# Patient Record
Sex: Male | Born: 1990 | Race: White | Hispanic: No | Marital: Single | Smoking: Current every day smoker
Health system: Southern US, Community
[De-identification: ages and names within clinical notes are randomized; demographics above are authoritative.]

## PROBLEM LIST (undated history)

## (undated) DIAGNOSIS — J45909 Unspecified asthma, uncomplicated: Secondary | ICD-10-CM

## (undated) HISTORY — DX: Unspecified asthma, uncomplicated: J45.909

---
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
## 2012-03-29 ENCOUNTER — Emergency Department: Payer: Self-pay | Admitting: Emergency Medicine

## 2012-03-29 LAB — COMPREHENSIVE METABOLIC PANEL
Alkaline Phosphatase: 108 U/L (ref 98–317)
Bilirubin,Total: 0.5 mg/dL (ref 0.2–1.0)
Chloride: 104 mmol/L (ref 98–107)
Co2: 30 mmol/L (ref 21–32)
Creatinine: 0.74 mg/dL (ref 0.60–1.30)
EGFR (Non-African Amer.): >60
Osmolality: 275 (ref 275–301)
Sodium: 138 mmol/L (ref 136–145)

## 2012-03-29 LAB — CBC
HCT: 44.9 % (ref 40.0–52.0)
MCH: 29.1 pg (ref 26.0–34.0)
MCV: 85 fL (ref 80–100)
RBC: 5.3 10*6/uL (ref 4.40–5.90)
RDW: 13.4 % (ref 11.5–14.5)

## 2012-03-29 LAB — URINALYSIS, COMPLETE
Bilirubin,UR: NEGATIVE
Blood: NEGATIVE
Ketone: NEGATIVE
Nitrite: NEGATIVE
Ph: 8 (ref 4.5–8.0)
Protein: NEGATIVE
Specific Gravity: 1.016 (ref 1.003–1.030)
Squamous Epithelial: NONE SEEN

## 2012-04-01 ENCOUNTER — Emergency Department: Payer: Self-pay | Admitting: Unknown Physician Specialty

## 2012-04-01 LAB — CBC WITH DIFFERENTIAL/PLATELET
Basophil #: 0 10*3/uL (ref 0.0–0.1)
Eosinophil #: 0.4 10*3/uL (ref 0.0–0.7)
HCT: 48.7 % (ref 40.0–52.0)
Lymphocyte #: 2.2 10*3/uL (ref 1.0–3.6)
MCHC: 34.5 g/dL (ref 32.0–36.0)
MCV: 85 fL (ref 80–100)
Monocyte #: 0.5 x10 3/mm (ref 0.2–1.0)
Monocyte %: 6.7 %
Neutrophil #: 4.4 10*3/uL (ref 1.4–6.5)
Neutrophil %: 58.5 %
Platelet: 266 10*3/uL (ref 150–440)
RDW: 13.3 % (ref 11.5–14.5)
WBC: 7.5 10*3/uL (ref 3.8–10.6)

## 2012-04-01 LAB — COMPREHENSIVE METABOLIC PANEL
Albumin: 4.3 g/dL (ref 3.8–5.6)
Alkaline Phosphatase: 118 U/L (ref 98–317)
Bilirubin,Total: 0.4 mg/dL (ref 0.2–1.0)
Creatinine: 0.8 mg/dL (ref 0.60–1.30)
Glucose: 79 mg/dL (ref 65–99)
Osmolality: 275 (ref 275–301)
Sodium: 139 mmol/L (ref 136–145)

## 2012-04-01 LAB — DRUG SCREEN, URINE
Amphetamines, Ur Screen: NEGATIVE (ref ?–1000)
Barbiturates, Ur Screen: NEGATIVE (ref ?–200)
Benzodiazepine, Ur Scrn: NEGATIVE (ref ?–200)
Cannabinoid 50 Ng, Ur ~~LOC~~: NEGATIVE (ref ?–50)
Cocaine Metabolite,Ur ~~LOC~~: NEGATIVE (ref ?–300)
Methadone, Ur Screen: NEGATIVE (ref ?–300)
Opiate, Ur Screen: POSITIVE (ref ?–300)
Phencyclidine (PCP) Ur S: NEGATIVE (ref ?–25)
Tricyclic, Ur Screen: NEGATIVE (ref ?–1000)

## 2012-04-01 LAB — URINALYSIS, COMPLETE
Bacteria: NONE SEEN
Blood: NEGATIVE
Glucose,UR: NEGATIVE mg/dL (ref 0–75)
Ketone: NEGATIVE
Leukocyte Esterase: NEGATIVE
Nitrite: NEGATIVE
Ph: 8 (ref 4.5–8.0)
Protein: NEGATIVE
Specific Gravity: 1.01 (ref 1.003–1.030)

## 2012-04-01 LAB — MAGNESIUM: Magnesium: 1.6 mg/dL — ABNORMAL LOW

## 2012-04-03 ENCOUNTER — Emergency Department: Payer: Self-pay | Admitting: Emergency Medicine

## 2012-04-03 LAB — CBC
HGB: 15.9 g/dL (ref 13.0–18.0)
MCH: 28.6 pg (ref 26.0–34.0)
MCHC: 33.9 g/dL (ref 32.0–36.0)
MCV: 84 fL (ref 80–100)
Platelet: 268 10*3/uL (ref 150–440)
RBC: 5.56 10*6/uL (ref 4.40–5.90)
WBC: 7.5 10*3/uL (ref 3.8–10.6)

## 2012-04-03 LAB — COMPREHENSIVE METABOLIC PANEL
Albumin: 4.4 g/dL (ref 3.8–5.6)
Alkaline Phosphatase: 117 U/L (ref 98–317)
Anion Gap: 6 — ABNORMAL LOW (ref 7–16)
BUN: 10 mg/dL (ref 7–18)
Bilirubin,Total: 0.5 mg/dL (ref 0.2–1.0)
Calcium, Total: 9.2 mg/dL (ref 9.0–10.7)
Creatinine: 0.96 mg/dL (ref 0.60–1.30)
Glucose: 93 mg/dL (ref 65–99)
Osmolality: 276 (ref 275–301)
SGOT(AST): 36 U/L (ref 10–41)
SGPT (ALT): 41 U/L (ref 12–78)
Sodium: 139 mmol/L (ref 136–145)
Total Protein: 8.1 g/dL (ref 6.4–8.6)

## 2012-04-03 LAB — LIPID PANEL
Cholesterol: 128 mg/dL (ref 0–200)
Ldl Cholesterol, Calc: 57 mg/dL (ref 0–100)
VLDL Cholesterol, Calc: 35 mg/dL (ref 5–40)

## 2012-04-03 LAB — LIPASE, BLOOD: Lipase: 320 U/L (ref 73–393)

## 2013-02-17 IMAGING — CT CT ABD-PELV W/ CM
1 of 2 series · 15 of 32 positions shown, 19 images · non-contrast
Comparison: none

REASON FOR EXAM: (1) abd pain and elevated lipase; (2) abd pain and
elevated lipase
COMMENTS:

[Series 2: 3mm soft tissue · axial · 0.70mm/px · z∈[-1012,-584]mm · 15 of 157 slices shown, 19 images]
[im 7/157  soft-tissue]
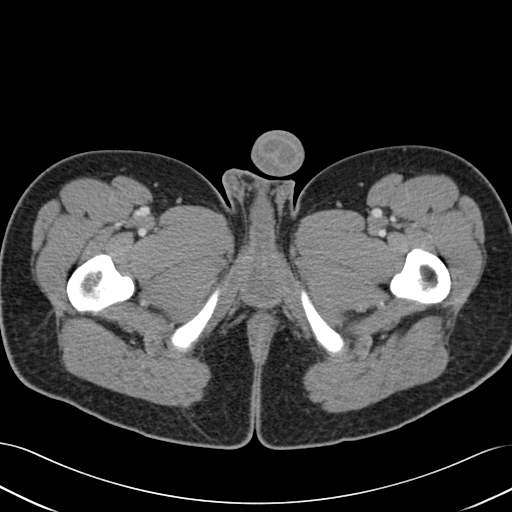
[im 7/157  bone]
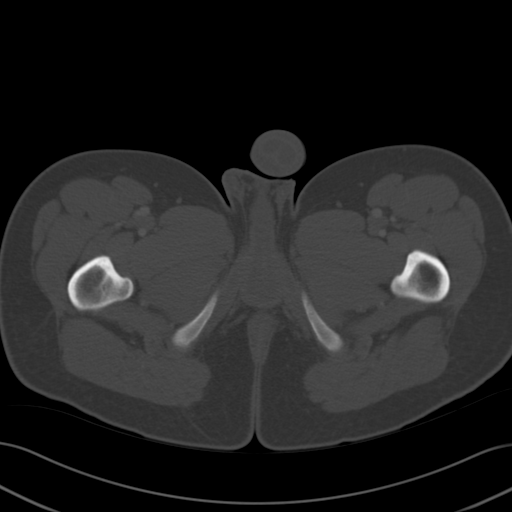
[im 20/157  soft-tissue]
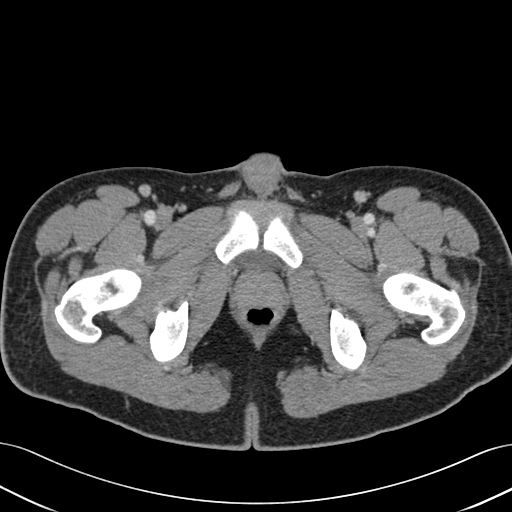
[im 33/157  soft-tissue]
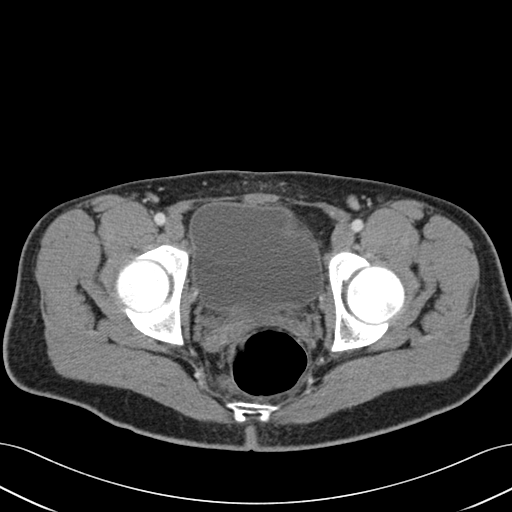
[im 46/157  soft-tissue]
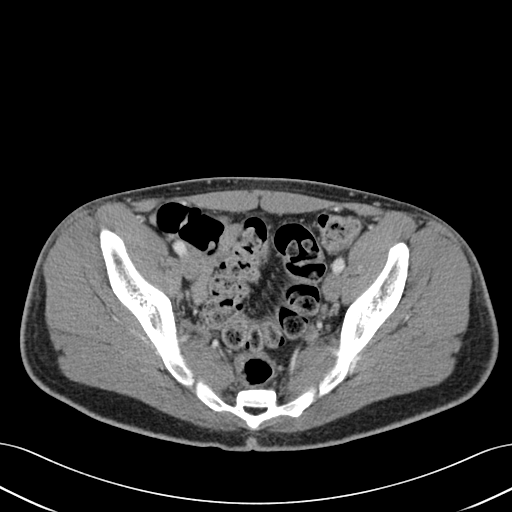
[im 53/157  soft-tissue]
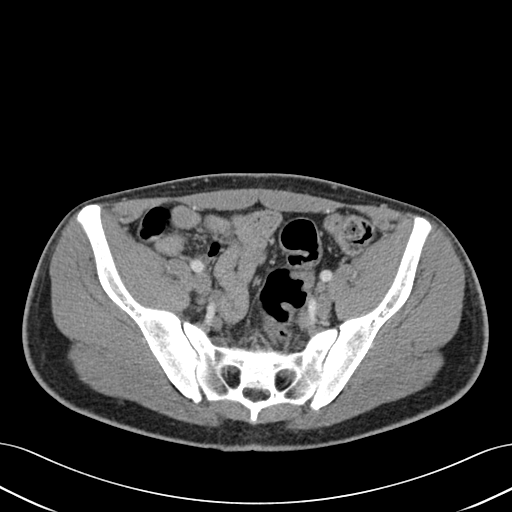
[im 66/157  soft-tissue]
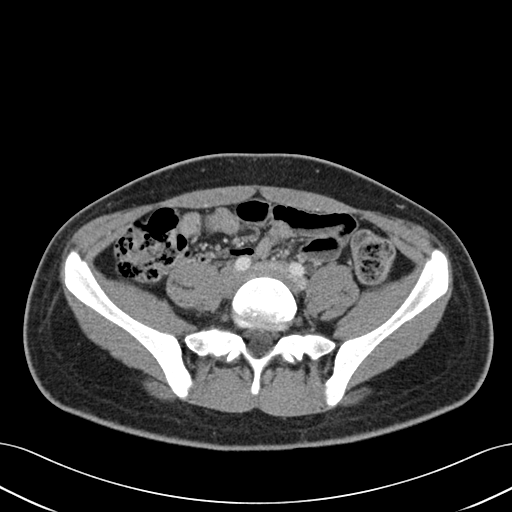
[im 79/157  soft-tissue]
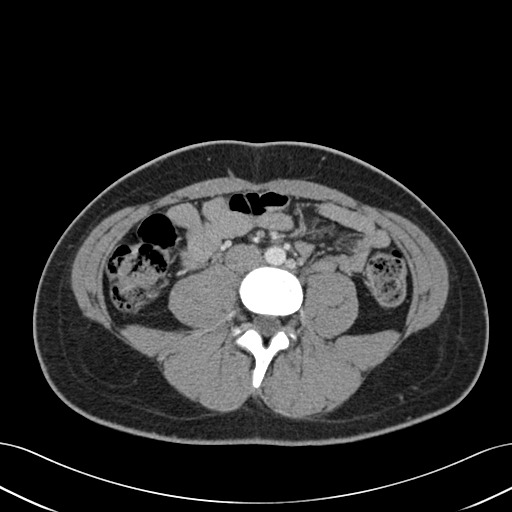
[im 92/157  soft-tissue]
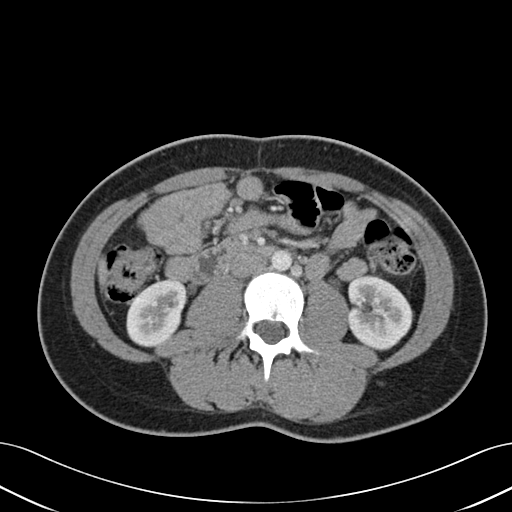
[im 105/157  soft-tissue]
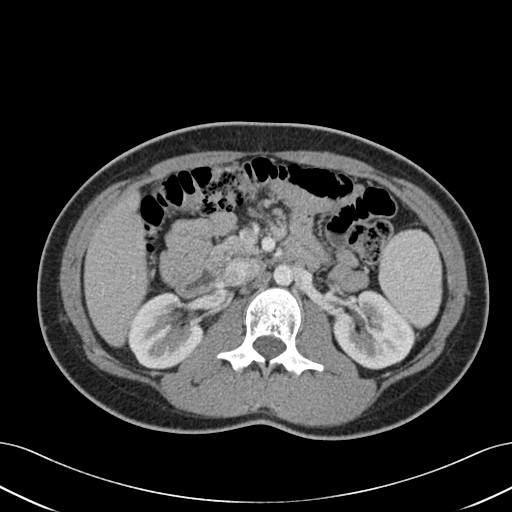
[im 105/157  bone]
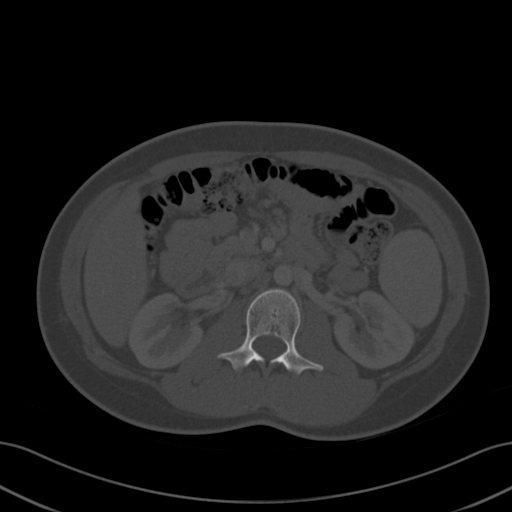
[im 111/157  soft-tissue]
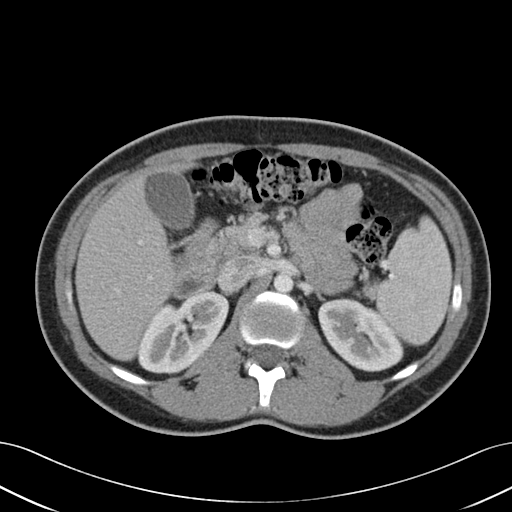
[im 124/157  soft-tissue]
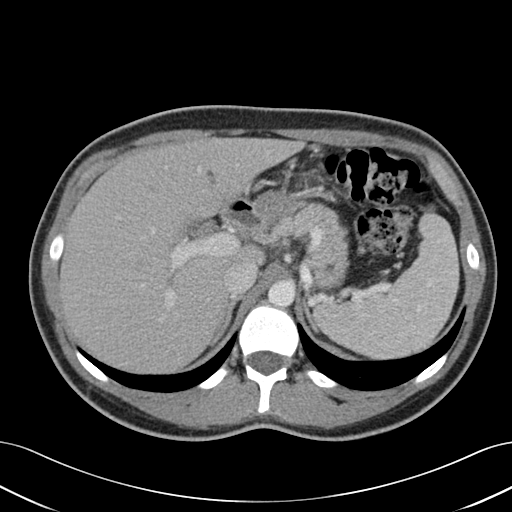
[im 131/157  lung]
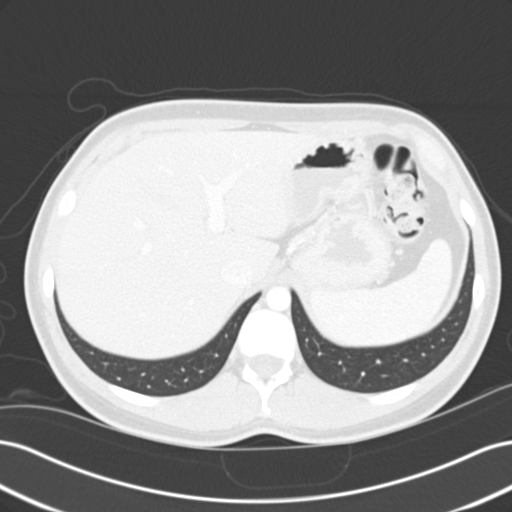
[im 137/157  soft-tissue]
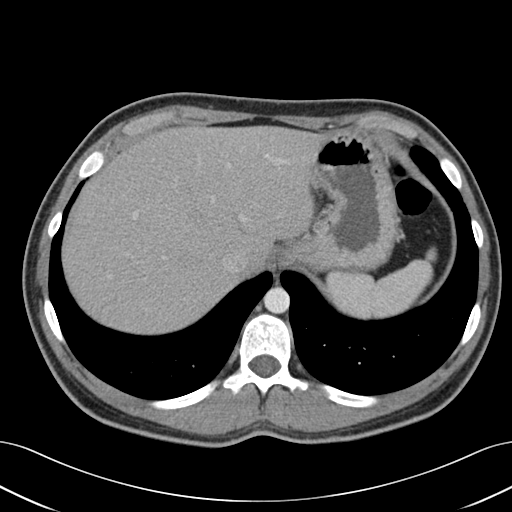
[im 137/157  lung]
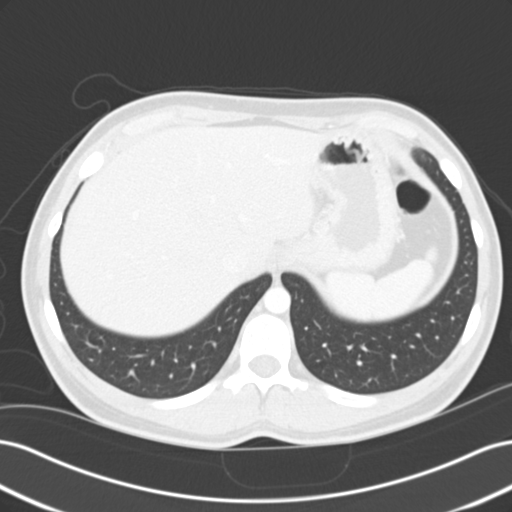
[im 144/157  lung]
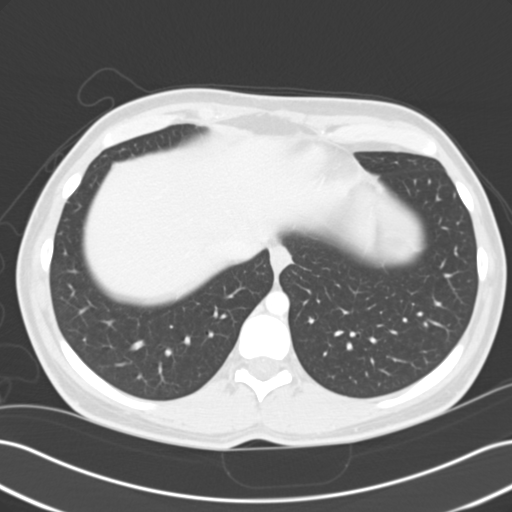
[im 150/157  soft-tissue]
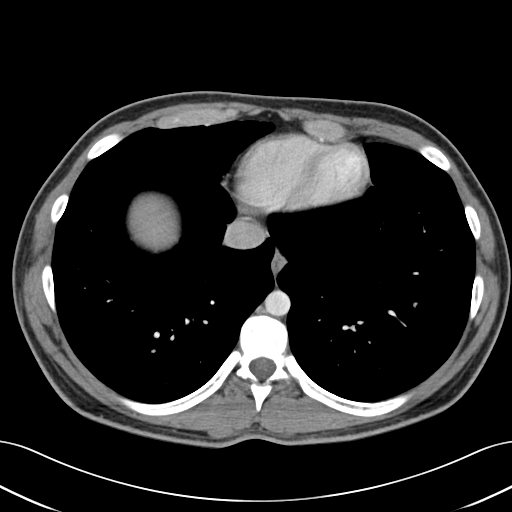
[im 150/157  lung]
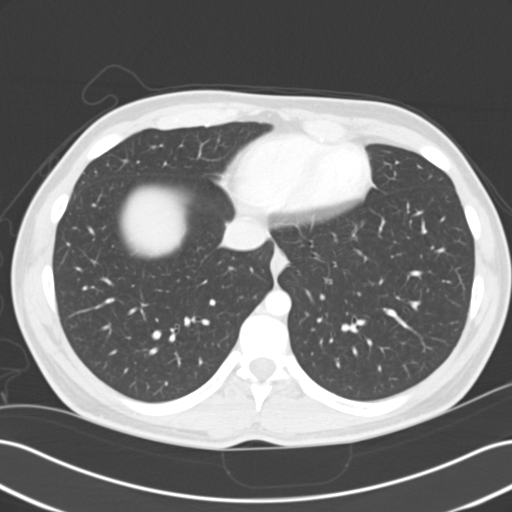

[15 of 32 positions shown; findings below may reference images not displayed]

PROCEDURE:     CT  - CT ABDOMEN / PELVIS  W  - March 29, 2012  [DATE]

RESULT:     Axial CT scanning was performed through the abdomen and pelvis
with reconstructions at 3 mm intervals and slice thicknesses following
intravenous administration of 100 cc of Isovue 300. The patient did not
receive oral contrast material. Review of multiplanar reconstructed images
was performed separately on the VIA monitor.

Evaluation of the small and large bowel is limited without oral contrast.
The stool and gas pattern appears normal and does not suggest acute
inflammation. A normal calibered gas-filled appendix is demonstrated on
images 90 through 100.

The liver exhibits no focal mass nor ductal dilation. The gallbladder is
adequately distended with no evidence of stones, wall thickening, or
pericholecystic fluid. The pancreas, spleen, partially distended stomach,
adrenal glands, and kidneys are normal in appearance. The caliber of the
abdominal aorta is normal. The periaortic and pericaval regions exhibit no
acute abnormalities.

Within the pelvis the partially distended urinary bladder is normal in
appearance. The uterus appears small or may be surgically absent. There are
no adnexal masses. There are a few borderline enlarged inguinal lymph nodes.

The lung bases are clear. The lumbar vertebral bodies are preserved in
height.
IMPRESSION: 1. There is no objective evidence of acute abnormality of the pancreas nor
of the liver or gallbladder. Low-grade pancreatitis could certainly be
present in the face of few if any CT findings.
2. There is no evidence of acute urinary tract abnormality.
3. As best as can be determined there is no acute bowel abnormality. A
normal calibered gas-filled appendix is demonstrated.

[REDACTED]

## 2013-07-09 ENCOUNTER — Emergency Department: Payer: Self-pay | Admitting: Emergency Medicine

## 2013-08-05 ENCOUNTER — Emergency Department: Payer: Self-pay | Admitting: Emergency Medicine

## 2013-10-15 ENCOUNTER — Emergency Department: Payer: Self-pay | Admitting: Emergency Medicine

## 2013-10-16 LAB — CK: CK, Total: 58 U/L

## 2014-11-05 ENCOUNTER — Encounter (INDEPENDENT_AMBULATORY_CARE_PROVIDER_SITE_OTHER): Payer: Self-pay

## 2014-11-05 ENCOUNTER — Ambulatory Visit (INDEPENDENT_AMBULATORY_CARE_PROVIDER_SITE_OTHER): Payer: BLUE CROSS/BLUE SHIELD | Admitting: Nurse Practitioner

## 2014-11-05 ENCOUNTER — Encounter: Payer: Self-pay | Admitting: Nurse Practitioner

## 2014-11-05 VITALS — BP 108/62 | HR 93 | Temp 98.1°F | Resp 14 | Ht 68.5 in | Wt 122.5 lb

## 2014-11-05 DIAGNOSIS — Z0000 Encounter for general adult medical examination without abnormal findings: Secondary | ICD-10-CM | POA: Diagnosis not present

## 2014-11-05 DIAGNOSIS — Z7689 Persons encountering health services in other specified circumstances: Secondary | ICD-10-CM

## 2014-11-05 DIAGNOSIS — Z7189 Other specified counseling: Secondary | ICD-10-CM | POA: Diagnosis not present

## 2014-11-05 DIAGNOSIS — Z8619 Personal history of other infectious and parasitic diseases: Secondary | ICD-10-CM | POA: Diagnosis not present

## 2014-11-05 LAB — COMPREHENSIVE METABOLIC PANEL
ALT: 15 U/L (ref 0–53)
AST: 19 U/L (ref 0–37)
Albumin: 4.3 g/dL (ref 3.5–5.2)
Alkaline Phosphatase: 74 U/L (ref 39–117)
BILIRUBIN TOTAL: 0.4 mg/dL (ref 0.2–1.2)
BUN: 12 mg/dL (ref 6–23)
CO2: 27 mEq/L (ref 19–32)
Calcium: 9.9 mg/dL (ref 8.4–10.5)
Chloride: 103 mEq/L (ref 96–112)
Creatinine, Ser: 0.79 mg/dL (ref 0.40–1.50)
GFR: 130.72 mL/min (ref >60.00)
GLUCOSE: 87 mg/dL (ref 70–99)
Potassium: 5.2 mEq/L — ABNORMAL HIGH (ref 3.5–5.1)
Sodium: 136 mEq/L (ref 135–145)
Total Protein: 7.3 g/dL (ref 6.0–8.3)

## 2014-11-05 LAB — HEMOGLOBIN A1C: HEMOGLOBIN A1C: 5.4 % (ref 4.6–6.5)

## 2014-11-05 LAB — CBC WITH DIFFERENTIAL/PLATELET
Basophils Absolute: 0 10*3/uL (ref 0.0–0.1)
Basophils Relative: 0.5 % (ref 0.0–3.0)
Eosinophils Absolute: 0.3 10*3/uL (ref 0.0–0.7)
Eosinophils Relative: 4.8 % (ref 0.0–5.0)
HCT: 46.8 % (ref 39.0–52.0)
Hemoglobin: 16 g/dL (ref 13.0–17.0)
LYMPHS PCT: 31.6 % (ref 12.0–46.0)
Lymphs Abs: 2.2 10*3/uL (ref 0.7–4.0)
MCHC: 34.2 g/dL (ref 30.0–36.0)
MCV: 84.1 fl (ref 78.0–100.0)
Monocytes Absolute: 0.4 10*3/uL (ref 0.1–1.0)
Monocytes Relative: 5.6 % (ref 3.0–12.0)
NEUTROS ABS: 3.9 10*3/uL (ref 1.4–7.7)
NEUTROS PCT: 57.5 % (ref 43.0–77.0)
Platelets: 232 10*3/uL (ref 150.0–400.0)
RBC: 5.56 Mil/uL (ref 4.22–5.81)
RDW: 13.3 % (ref 11.5–15.5)
WBC: 6.8 10*3/uL (ref 4.0–10.5)

## 2014-11-05 LAB — TSH: TSH: 1.55 u[IU]/mL (ref 0.35–4.50)

## 2014-11-05 NOTE — Progress Notes (Signed)
Pre visit review using our clinic review tool, if applicable. No additional management support is needed unless otherwise documented below in the visit note. 

## 2014-11-05 NOTE — Patient Instructions (Signed)
Call us if you need us!   Welcome to [facility]. We will contact you with your lab results.

## 2014-11-05 NOTE — Progress Notes (Signed)
Subjective:    Patient ID: Mason Perkins, male    DOB: Nov 27, 1990, 24 y.o.   MRN: [id number]  HPI  Mason Perkins is a 24 yo male establishing care today.   1) Health Maintenance-   Diet- Eats at home   Exercise- No formal  Immunizations- Unsure UTD   Eye Exam- UTD March  Dental Exam- UTD April   2) Chronic Problems-     Cutting down on cigarettes he reports.        Pt is thin- he reports he was very poor for awhile and lost a lot of weight. He has a job now and money, but his appetite is poor and he enjoys a peanut butter sandwich daily.   Scabies- recent scabies- treated with permethrin and improved, still itching and has a spot or two concerned about.           3) Acute Problems-    no acute issues today    Review of Systems  Constitutional: Negative for fever, chills, diaphoresis and fatigue.  HENT: Negative for sore throat and tinnitus.   Eyes: Negative for visual disturbance.  Respiratory: Negative for chest tightness, shortness of breath and wheezing.   Cardiovascular: Negative for chest pain, palpitations and leg swelling.  Gastrointestinal: Positive for constipation. Negative for nausea, vomiting and diarrhea.  Genitourinary: Negative for difficulty urinating.  Musculoskeletal: Positive for back pain. Negative for neck pain.  Skin: Negative for rash.  Neurological: Negative for dizziness, weakness, numbness and headaches.  Hematological: Does not bruise/bleed easily.  Psychiatric/Behavioral: Negative for suicidal ideas and sleep disturbance. The patient is not nervous/anxious.    Past Medical History  Diagnosis Date  . Asthma     History   Social History  . Marital Status: Single    Spouse Name: N/A  . Number of Children: N/A  . Years of Education: N/A   Occupational History  . Not on file.   Social History Main Topics  . Smoking status: Current Every Day Smoker -- 1.00 packs/day for 6 years    Types: Cigarettes  . Smokeless tobacco: Never Used  . Alcohol  Use: 0.0 oz/week    0 Standard drinks or equivalent per week     Comment: Rare  . Drug Use: No  . Sexual Activity:    Partners: Female     Comment: Fiance    Other Topics Concern  . 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  Family History  Problem Relation Age of Onset  . Stroke Maternal Aunt   . Diabetes Maternal Grandfather     Allergies  Allergen Reactions  . Aspirin Swelling  . Ibuprofen Swelling    No current outpatient prescriptions on file prior to visit.   No current facility-administered medications on file prior to visit.        Objective:   Physical Exam  Constitutional: He is oriented to person, place, and time. He appears well-developed and well-nourished. No distress.  BP 108/62 mmHg  Pulse 93  Temp(Src) 98.1 F (36.7 C) (Oral)  Resp 14  Ht 5' 8.5" (1.74 m)  Wt 122 lb 8 oz (55.566 kg)  BMI 18.35 kg/m2  SpO2 98%  Poor hygeine  HENT:  Head: Normocephalic and atraumatic.  Right Ear: External ear normal.  Left Ear: External ear normal.  Eyes: EOM are normal. Pupils are equal, round, and reactive to light. Right eye exhibits no discharge. Left eye exhibits no discharge. No scleral icterus.  Glasses  Neck: Normal range of motion. Neck supple. No thyromegaly present.  Cardiovascular: Normal rate, regular rhythm, normal heart sounds and intact distal pulses.  Exam reveals no gallop and no friction rub.   No murmur heard. Pulmonary/Chest: Effort normal and breath sounds normal. No respiratory distress. He has no wheezes. He has no rales. He exhibits no tenderness.  Abdominal: Soft. Bowel sounds are normal. He exhibits no distension and no mass. There is no tenderness. There is no rebound and no guarding.  Very thin (reports he had money trouble for  awhile and is now having trouble with appetite)   Musculoskeletal: Normal range of motion. He exhibits no edema or tenderness.  Lymphadenopathy:    He has no cervical adenopathy.  Neurological: He is alert and oriented to person, place, and time.  Skin: Skin is warm and dry. No rash noted. He is not diaphoretic.  Psychiatric: He has a normal mood and affect. His behavior is normal. Judgment and thought content normal.       Assessment & Plan:

## 2014-11-06 ENCOUNTER — Telehealth: Payer: Self-pay | Admitting: Nurse Practitioner

## 2014-11-06 NOTE — Telephone Encounter (Signed)
Patient stated he saw NP for a scabies out break on 11/04/14 and patient is still itching but is out of medication and was advised if still itching to apply for a second dose. Please advise.

## 2014-11-07 ENCOUNTER — Other Ambulatory Visit: Payer: Self-pay | Admitting: Nurse Practitioner

## 2014-11-07 ENCOUNTER — Telehealth: Payer: Self-pay

## 2014-11-07 MED ORDER — PERMETHRIN 5 % EX CREA: 1.0000 "application " | TOPICAL_CREAM | Freq: Once | CUTANEOUS | Status: DC

## 2014-11-07 NOTE — Telephone Encounter (Signed)
Notified patient of results per NP and he verbalized understanding.

## 2014-11-19 DIAGNOSIS — Z8619 Personal history of other infectious and parasitic diseases: Secondary | ICD-10-CM | POA: Insufficient documentation

## 2014-11-19 DIAGNOSIS — Z7689 Persons encountering health services in other specified circumstances: Secondary | ICD-10-CM | POA: Insufficient documentation

## 2014-11-19 DIAGNOSIS — Z0000 Encounter for general adult medical examination without abnormal findings: Secondary | ICD-10-CM | POA: Insufficient documentation

## 2014-11-19 NOTE — Assessment & Plan Note (Signed)
Will work with pt on gaining weight again. Discussed acute and chronic issues. Reviewed health maintenance measures, PFSHx, and immunizations. Obtain routine labs TSH, Lipid panel, CBC w/ diff, A1c, and CMET.   Unsure about tetanus shot- Will obtain records.

## 2014-11-19 NOTE — Assessment & Plan Note (Signed)
Recent tx for scabies. Pt reports still itching, after discussing that it could last awhile after treatment he was concerned about a spot around his hands that may not have been covered. Gave another handout of cream.

## 2015-10-19 ENCOUNTER — Encounter: Payer: Self-pay | Admitting: Family Medicine

## 2015-10-19 ENCOUNTER — Ambulatory Visit (INDEPENDENT_AMBULATORY_CARE_PROVIDER_SITE_OTHER): Payer: BLUE CROSS/BLUE SHIELD | Admitting: Family Medicine

## 2015-10-19 VITALS — BP 108/74 | HR 78 | Temp 98.0°F | Wt 137.0 lb

## 2015-10-19 DIAGNOSIS — R112 Nausea with vomiting, unspecified: Secondary | ICD-10-CM | POA: Insufficient documentation

## 2015-10-19 DIAGNOSIS — R531 Weakness: Secondary | ICD-10-CM | POA: Insufficient documentation

## 2015-10-19 LAB — COMPREHENSIVE METABOLIC PANEL
ALT: 14 U/L (ref 0–53)
AST: 14 U/L (ref 0–37)
Albumin: 4.5 g/dL (ref 3.5–5.2)
Alkaline Phosphatase: 77 U/L (ref 39–117)
BUN: 13 mg/dL (ref 6–23)
CO2: 26 mEq/L (ref 19–32)
Calcium: 9.6 mg/dL (ref 8.4–10.5)
Chloride: 103 mEq/L (ref 96–112)
Creatinine, Ser: 0.61 mg/dL (ref 0.40–1.50)
GFR: 174.64 mL/min (ref >60.00)
Glucose, Bld: 72 mg/dL (ref 70–99)
Potassium: 4.1 mEq/L (ref 3.5–5.1)
Sodium: 138 mEq/L (ref 135–145)
Total Bilirubin: 0.4 mg/dL (ref 0.2–1.2)
Total Protein: 7.1 g/dL (ref 6.0–8.3)

## 2015-10-19 LAB — CBC
HCT: 48 % (ref 39.0–52.0)
HEMOGLOBIN: 16.1 g/dL (ref 13.0–17.0)
MCHC: 33.5 g/dL (ref 30.0–36.0)
MCV: 86.2 fl (ref 78.0–100.0)
PLATELETS: 249 10*3/uL (ref 150.0–400.0)
RBC: 5.56 Mil/uL (ref 4.22–5.81)
RDW: 13.6 % (ref 11.5–15.5)
WBC: 5.2 10*3/uL (ref 4.0–10.5)

## 2015-10-19 LAB — POCT INFLUENZA A/B
Influenza A, POC: NEGATIVE
Influenza B, POC: NEGATIVE

## 2015-10-19 MED ORDER — ONDANSETRON 4 MG PO TBDP: 4.0000 mg | ORAL_TABLET | Freq: Three times a day (TID) | ORAL | Status: DC | PRN

## 2015-10-19 NOTE — Patient Instructions (Signed)
Use the zofran as needed for nausea.  We will call with your lab results.  Call if you fail to improve or worsen.  Take care  Dr. Adriana Simasook

## 2015-10-19 NOTE — Assessment & Plan Note (Signed)
New problem.  Exam benign. Obtaining labs today.

## 2015-10-19 NOTE — Assessment & Plan Note (Signed)
New problem. Patient will likely underlying viral illness given symptoms. Influenza negative today. Exam unremarkable. Treating with Zofran.

## 2015-10-19 NOTE — Progress Notes (Signed)
Subjective:  Patient ID: Mason Perkins, male    DOB: 26-Aug-1990  Age: 25 y.o. MRN: [id number]  CC: Weakness, N/V  HPI:  25 year old male presents to clinic today with the above complaints.  Patient states that he has not felt well since Saturday. He states that on Saturday he was "feeling bad". He states that he had nausea and fever that time. He is unaware of how high his temperature was. He does endorse some body aches as well. He states that the weakness and nausea continued into Sunday. He had one episode of emesis on Sunday. It was nonbloody and nonbilious. He's had continued symptoms since then. He reports decreased PO intake. No recent diarrhea. No abdominal pain. No other complaints this time. No known exacerbating or relieving factors. He has been out of work for the past 2 days.  Social Hx   Social History   Social History  . Marital Status: Single    Spouse Name: N/A  . Number of Children: N/A  . Years of Education: N/A   Social History Main Topics  . Smoking status: Current Every Day Smoker -- 1.00 packs/day for 6 years    Types: Cigarettes  . Smokeless tobacco: Never Used  . Alcohol Use: 0.0 oz/week    0 Standard drinks or equivalent per week     Comment: Rare  . Drug Use: No  . Sexual Activity:    Partners: Female     Comment: Fiance    Other Topics Concern  . None   Social History Narrative   Builds spools for fiber optic wire   Lives with fiance    No children   2 dogs inside    Caffeine- 1 cup of coffee, no soda    Left handed    Enjoys watching TV    Review of Systems  Constitutional: Positive for fever.  Gastrointestinal: Positive for nausea and vomiting. Negative for abdominal pain.  Neurological: Positive for weakness.   Objective:  BP 108/74 mmHg  Pulse 78  Temp(Src) 98 F (36.7 C) (Oral)  Wt 137 lb (62.143 kg)  SpO2 98%  BP/Weight 10/19/2015 11/05/2014  Systolic BP 108 108  Diastolic BP 74 62  Wt. (Lbs) 137 122.5  BMI 20.53 18.35    Physical Exam  Constitutional: He is oriented to person, place, and time.  Appears fatigued/weak. NAD.  HENT:  Head: Normocephalic and atraumatic.  Mouth/Throat: Oropharynx is clear and moist.  Cardiovascular: Normal rate and regular rhythm.   Pulmonary/Chest: Effort normal and breath sounds normal. He has no wheezes. He has no rales.  Neurological: He is alert and oriented to person, place, and time.  Psychiatric:  Flat affect.  Vitals reviewed.  Lab Results  Component Value Date   WBC 6.8 11/05/2014   HGB 16.0 11/05/2014   HCT 46.8 11/05/2014   PLT 232.0 11/05/2014   GLUCOSE 87 11/05/2014   CHOL 128 04/03/2012   TRIG 175* 04/03/2012   HDL 36* 04/03/2012   LDLCALC 57 04/03/2012   ALT 15 11/05/2014   AST 19 11/05/2014   NA 136 11/05/2014   K 5.2* 11/05/2014   CL 103 11/05/2014   CREATININE 0.79 11/05/2014   BUN 12 11/05/2014   CO2 27 11/05/2014   TSH 1.55 11/05/2014   HGBA1C 5.4 11/05/2014   Assessment & Plan:   Problem List Items Addressed This Visit    Weakness - Primary    New problem.  Exam benign. Obtaining labs today.  Relevant Orders   POCT Influenza A/B (Completed)   CBC   Comprehensive metabolic panel   Nausea and vomiting    New problem. Patient will likely underlying viral illness given symptoms. Influenza negative today. Exam unremarkable. Treating with Zofran.        Meds ordered this encounter  Medications  . ondansetron (ZOFRAN ODT) 4 MG disintegrating tablet    Sig: Take 1 tablet (4 mg total) by mouth every 8 (eight) hours as needed for nausea or vomiting.    Dispense:  20 tablet    Refill:  0   Follow-up: PRN   Everlene OtherJayce Bina Veenstra DO [facility] [place]

## 2017-11-28 ENCOUNTER — Encounter (HOSPITAL_COMMUNITY): Payer: Self-pay | Admitting: Emergency Medicine

## 2017-11-28 ENCOUNTER — Emergency Department (HOSPITAL_COMMUNITY)
Admission: EM | Admit: 2017-11-28 | Discharge: 2017-11-28 | Disposition: A | Discharge status: Home / Self Care | Payer: Managed Care, Other (non HMO) | Attending: Emergency Medicine | Admitting: Emergency Medicine

## 2017-11-28 ENCOUNTER — Other Ambulatory Visit: Payer: Self-pay

## 2017-11-28 DIAGNOSIS — J45909 Unspecified asthma, uncomplicated: Secondary | ICD-10-CM | POA: Diagnosis not present

## 2017-11-28 DIAGNOSIS — T783XXA Angioneurotic edema, initial encounter: Secondary | ICD-10-CM | POA: Diagnosis not present

## 2017-11-28 DIAGNOSIS — F17210 Nicotine dependence, cigarettes, uncomplicated: Secondary | ICD-10-CM | POA: Insufficient documentation

## 2017-11-28 DIAGNOSIS — R220 Localized swelling, mass and lump, head: Secondary | ICD-10-CM | POA: Diagnosis present

## 2017-11-28 MED ORDER — DIPHENHYDRAMINE HCL 25 MG PO CAPS
50.0000 mg | ORAL_CAPSULE | Freq: Once | ORAL | Status: AC
  Administered 2017-11-28: 50 mg via ORAL
  Filled 2017-11-28: qty 2

## 2017-11-28 MED ORDER — DEXAMETHASONE 4 MG PO TABS
8.0000 mg | ORAL_TABLET | Freq: Once | ORAL | Status: AC
  Administered 2017-11-28: 8 mg via ORAL
  Filled 2017-11-28: qty 2

## 2017-11-28 MED ORDER — EPINEPHRINE 0.3 MG/0.3ML IJ SOAJ: 0.3000 mg | Freq: Once | INTRAMUSCULAR | 1 refills | Status: AC

## 2017-11-28 MED ORDER — DIPHENHYDRAMINE HCL 25 MG PO TABS: 25.0000 mg | ORAL_TABLET | Freq: Four times a day (QID) | ORAL | 0 refills | Status: DC

## 2017-11-28 NOTE — ED Triage Notes (Signed)
Pt states he woke up with his bottom lip swollen and denies trying anything new.

## 2017-11-28 NOTE — ED Provider Notes (Signed)
[facility] EMERGENCY DEPARTMENT Provider Note   CSN: [id number] Arrival date & time: 11/28/17  8295     History   Chief Complaint Chief Complaint  Patient presents with  . Allergic Reaction    Pt seen with NP student, I performed history/physical/documentation HPI Mason Perkins is a 27 y.o. male.  The history is provided by the patient.  Allergic Reaction  Presenting symptoms: swelling   Presenting symptoms: no rash   Severity:  Mild Duration:  3 hours Prior allergic episodes:  No prior episodes Relieved by:  None tried Worsened by:  Nothing Ineffective treatments:  None tried Patient with history of asthma presents with lip swelling.  He reports he went to bed without any difficulty, woke up and his lower lip was swollen.  No tongue swelling.  No upper lip swelling.  No difficulty swallowing or difficulty breathing.  No chest pain or shortness of breath.  No rash.  He has never had this before. He reports he ate at [employer] yesterday, but no other new issues.  Past Medical History:  Diagnosis Date  . Asthma     Patient Active Problem List   Diagnosis Date Noted  . Weakness 10/19/2015  . Nausea and vomiting 10/19/2015  . Encounter to establish care 11/19/2014  . Routine general medical examination at a health care facility 11/19/2014  . History of scabies 11/19/2014    History reviewed. No pertinent surgical history.      Home Medications    Prior to Admission medications   Not on File    Family History Family History  Problem Relation Age of Onset  . Stroke Maternal Aunt   . Diabetes Maternal Grandfather     Social History Social History   Tobacco Use  . Smoking status: Current Every Day Smoker    Packs/day: 1.00    Years: 6.00    Pack years: 6.00    Types: Cigarettes  . Smokeless tobacco: Never Used  Substance Use Topics  . Alcohol use: Yes    Alcohol/week: 0.0 oz    Comment: Rare  . Drug use: No     Allergies   Aspirin and  Ibuprofen   Review of Systems Review of Systems  Constitutional: Negative for fever.  Gastrointestinal: Negative for diarrhea and vomiting.  Skin: Negative for rash.  All other systems reviewed and are negative.    Physical Exam Updated Vital Signs BP 111/80   Pulse 69   Temp 98 F (36.7 C) (Oral)   Resp 20   Wt 63.5 kg (140 lb)   SpO2 98%   BMI 20.98 kg/m   Physical Exam CONSTITUTIONAL: Well developed/well nourished HEAD: Normocephalic/atraumatic EYES: EOMI/PERRL ENMT: Mucous membranes moist Mild edema to lower lip, no signs of abscess or cellulitis.  No dental infection noted.  No tongue swelling, uvula midline without edema.  No stridor.  Normal phonation.  No drooling. NECK: supple no meningeal signs SPINE/BACK:entire spine nontender CV: S1/S2 noted, no murmurs/rubs/gallops noted LUNGS: Lungs are clear to auscultation bilaterally, no apparent distress ABDOMEN: soft, nontender NEURO: Pt is awake/alert/appropriate, moves all extremitiesx4.  No facial droop.   EXTREMITIES:  full ROM SKIN: warm, color normal PSYCH: no abnormalities of mood noted, alert and oriented to situation   ED Treatments / Results  Labs (all labs ordered are listed, but only abnormal results are displayed) Labs Reviewed - No data to display  EKG None  Radiology No results found.  Procedures Procedures  Medications Ordered in ED Medications  dexamethasone (DECADRON) tablet 8 mg (8 mg Oral Given 11/28/17 0531)  diphenhydrAMINE (BENADRYL) capsule 50 mg (50 mg Oral Given 11/28/17 0531)     Initial Impression / Assessment and Plan / ED Course  I have reviewed the triage vital signs and the nursing notes.   Appears improved.  He is resting comfortably.  The lip is less edematous at this time. No other signs of acute anaphylaxis.  Unclear cause of allergic reaction.  Will prescribe Benadryl, and he has already been given Decadron.  I also prescribed an EpiPen in case there is any  worsening.  I discussed appropriate use of EpiPen.  Will discharge home Final Clinical Impressions(s) / ED Diagnoses   Final diagnoses:  Angioedema, initial encounter    ED Discharge Orders        Ordered    diphenhydrAMINE (BENADRYL) 25 MG tablet  Every 6 hours     11/28/17 0616    EPINEPHrine (EPIPEN 2-PAK) 0.3 mg/0.3 mL IJ SOAJ injection   Once     11/28/17 [id number]       Zadie RhineWickline, Yue Glasheen, MD 11/28/17 [phone]

## 2018-04-06 ENCOUNTER — Encounter: Payer: Self-pay | Admitting: Emergency Medicine

## 2018-04-06 ENCOUNTER — Emergency Department
Admission: EM | Admit: 2018-04-06 | Discharge: 2018-04-06 | Disposition: A | Discharge status: Home / Self Care | Payer: No Typology Code available for payment source | Attending: Emergency Medicine | Admitting: Emergency Medicine

## 2018-04-06 ENCOUNTER — Other Ambulatory Visit: Payer: Self-pay

## 2018-04-06 DIAGNOSIS — Y990 Civilian activity done for income or pay: Secondary | ICD-10-CM | POA: Insufficient documentation

## 2018-04-06 DIAGNOSIS — Y9389 Activity, other specified: Secondary | ICD-10-CM | POA: Diagnosis not present

## 2018-04-06 DIAGNOSIS — S71152A Open bite, left thigh, initial encounter: Secondary | ICD-10-CM | POA: Diagnosis not present

## 2018-04-06 DIAGNOSIS — Y92007 Garden or yard of unspecified non-institutional (private) residence as the place of occurrence of the external cause: Secondary | ICD-10-CM | POA: Diagnosis not present

## 2018-04-06 DIAGNOSIS — W540XXA Bitten by dog, initial encounter: Secondary | ICD-10-CM | POA: Diagnosis not present

## 2018-04-06 DIAGNOSIS — F17210 Nicotine dependence, cigarettes, uncomplicated: Secondary | ICD-10-CM | POA: Diagnosis not present

## 2018-04-06 DIAGNOSIS — S7012XA Contusion of left thigh, initial encounter: Secondary | ICD-10-CM | POA: Diagnosis not present

## 2018-04-06 DIAGNOSIS — J45909 Unspecified asthma, uncomplicated: Secondary | ICD-10-CM | POA: Insufficient documentation

## 2018-04-06 MED ORDER — BACITRACIN-NEOMYCIN-POLYMYXIN 400-5-5000 EX OINT
TOPICAL_OINTMENT | Freq: Every day | CUTANEOUS | Status: DC
  Administered 2018-04-06: 1 via TOPICAL
  Filled 2018-04-06: qty 1

## 2018-04-06 MED ORDER — AMOXICILLIN-POT CLAVULANATE 875-125 MG PO TABS
1.0000 | ORAL_TABLET | Freq: Once | ORAL | Status: AC
  Administered 2018-04-06: 1 via ORAL
  Filled 2018-04-06: qty 1

## 2018-04-06 MED ORDER — AMOXICILLIN-POT CLAVULANATE 875-125 MG PO TABS: 1.0000 | ORAL_TABLET | Freq: Two times a day (BID) | ORAL | 0 refills | Status: AC

## 2018-04-06 NOTE — ED Notes (Signed)
No orders for lab or urine per supervisor for worker's comp.

## 2018-04-06 NOTE — ED Triage Notes (Signed)
PT to c/o dog bite to LFT upper thigh  . Pt was at work [employer], health at work is with pt.

## 2018-04-06 NOTE — Discharge Instructions (Addendum)
Please apply ice to the left thigh 20 minutes every hour.  Apply antibiotic ointment daily.  You may shower.  If any increasing pain, swelling, warmth, redness, drainage, numbness or tingling return to the emergency department.

## 2018-04-06 NOTE — ED Provider Notes (Signed)
[facility] EMERGENCY DEPARTMENT Provider Note   CSN: [id number] Arrival date & time: 04/06/18  1744     History   Chief Complaint No chief complaint on file.   HPI Mason Perkins is a 27 y.o. male presents for [employer]. injury for dog bite to the left anterior thigh.  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  Patient is uncertain what type of dog, states it was large.  Accident occurred around 4 PM today.  Patient's been ambulatory denies any other injury except for mild soreness to the left anterior thigh.  Dog punctured his left pant leg and suffered abrasions to the left thigh.  Patient did not fall.  Patient's tetanus is up-to-date.  Animal control has been notified.  Owner state dog is up-to-date with rabies vaccination.  HPI  Past Medical History:  Diagnosis Date  . Asthma     Patient Active Problem List   Diagnosis Date Noted  . Weakness 10/19/2015  . Nausea and vomiting 10/19/2015  . Encounter to establish care 11/19/2014  . Routine general medical examination at a health care facility 11/19/2014  . History of scabies 11/19/2014    History reviewed. No pertinent surgical history.      Home Medications    Prior to Admission medications   Medication Sig Start Date End Date Taking? Authorizing Provider  amoxicillin-clavulanate (AUGMENTIN) 875-125 MG tablet Take 1 tablet by mouth every 12 (twelve) hours for 5 days. 04/06/18 04/11/18  Evon Slack, PA-C  diphenhydrAMINE (BENADRYL) 25 MG tablet Take 1 tablet (25 mg total) by mouth every 6 (six) hours. 11/28/17   Zadie Rhine, MD    Family History Family History  Problem Relation Age of Onset  . Stroke Maternal Aunt   . Diabetes Maternal Grandfather     Social History Social History   Tobacco Use  . Smoking status: Current Every Day Smoker    Packs/day: 1.00    Years: 6.00    Pack years: 6.00    Types: Cigarettes  . Smokeless  tobacco: Never Used  Substance Use Topics  . Alcohol use: Yes    Alcohol/week: 0.0 standard drinks    Comment: Rare  . Drug use: No     Allergies   Aspirin and Ibuprofen   Review of Systems Review of Systems  Constitutional: Negative for fatigue and fever.  Musculoskeletal: Positive for myalgias. Negative for gait problem, joint swelling, neck pain and neck stiffness.  Skin: Positive for wound. Negative for color change.  Neurological: Negative for numbness.     Physical Exam Updated Vital Signs BP 127/89 (BP Location: Right Arm)   Pulse 83   Temp 98.6 F (37 C) (Oral)   Resp 16   SpO2 96%   Physical Exam  Constitutional: He is oriented to person, place, and time. He appears well-developed and well-nourished.  HENT:  Head: Normocephalic and atraumatic.  Eyes: Conjunctivae are normal.  Neck: Normal range of motion.  Cardiovascular: Normal rate.  Pulmonary/Chest: Effort normal. No respiratory distress.  Musculoskeletal: Normal range of motion.  Patient amatory with no assistive devices.  Normal active range of motion of the hip knee and ankle.  Thigh soft, lower leg is soft no swelling warmth erythema or edema.  Mild superficial abrasion to the left anterior thigh, 3 x 3 cm in diameter.  There is no deep skin penetration no visible or palpable foreign body.  Minimal soft tissue swelling under the abrasion.  No ecchymosis.  Patient is neurovascular intact in left lower extremity.  Neurological: He is alert and oriented to person, place, and time.  Skin: Skin is warm. No rash noted.  Psychiatric: He has a normal mood and affect. His behavior is normal. Thought content normal.     ED Treatments / Results  Labs (all labs ordered are listed, but only abnormal results are displayed) Labs Reviewed - No data to display  EKG None  Radiology No results found.  Procedures Procedures (including critical care time)  Medications Ordered in ED Medications    amoxicillin-clavulanate (AUGMENTIN) 875-125 MG per tablet 1 tablet (has no administration in time range)  neomycin-bacitracin-polymyxin (NEOSPORIN) ointment (has no administration in time range)     Initial Impression / Assessment and Plan / ED Course  I have reviewed the triage vital signs and the nursing notes.  Pertinent labs & imaging results that were available during my care of the patient were reviewed by me and considered in my medical decision making (see chart for details).     27 year old male with dog bite to the left anterior thigh.  Patient suffered abrasion with small hematoma to the left anterior thigh.  There is no sign of compartment syndrome.  No deep tissue penetration.  Patient placed on prophylactic antibiotics.  And medical ointment applied.  Ice pack applied.  Animal control was notified.  Patient's tetanus is up-to-date.  Patient will take Tylenol as needed for pain.  Final Clinical Impressions(s) / ED Diagnoses   Final diagnoses:  Dog bite of left thigh without complication, initial encounter  Hematoma of thigh, left, initial encounter    ED Discharge Orders         Ordered    amoxicillin-clavulanate (AUGMENTIN) 875-125 MG tablet  Every 12 hours     04/06/18 1822           Ronnette Juniper 04/06/18 1830    Dionne Bucy, MD 04/06/18 2350

## 2018-10-24 ENCOUNTER — Other Ambulatory Visit: Payer: Self-pay

## 2018-10-24 ENCOUNTER — Encounter (HOSPITAL_COMMUNITY): Payer: Self-pay | Admitting: Emergency Medicine

## 2018-10-24 ENCOUNTER — Emergency Department (HOSPITAL_COMMUNITY)
Admission: EM | Admit: 2018-10-24 | Discharge: 2018-10-24 | Disposition: A | Discharge status: Home / Self Care | Payer: 59 | Attending: Emergency Medicine | Admitting: Emergency Medicine

## 2018-10-24 DIAGNOSIS — T7840XA Allergy, unspecified, initial encounter: Secondary | ICD-10-CM | POA: Diagnosis not present

## 2018-10-24 DIAGNOSIS — F17210 Nicotine dependence, cigarettes, uncomplicated: Secondary | ICD-10-CM | POA: Insufficient documentation

## 2018-10-24 DIAGNOSIS — J45909 Unspecified asthma, uncomplicated: Secondary | ICD-10-CM | POA: Diagnosis not present

## 2018-10-24 DIAGNOSIS — R600 Localized edema: Secondary | ICD-10-CM | POA: Diagnosis present

## 2018-10-24 MED ORDER — FAMOTIDINE IN NACL 20-0.9 MG/50ML-% IV SOLN
20.0000 mg | Freq: Once | INTRAVENOUS | Status: AC
  Administered 2018-10-24: 20 mg via INTRAVENOUS
  Filled 2018-10-24: qty 50

## 2018-10-24 MED ORDER — METHYLPREDNISOLONE SODIUM SUCC 125 MG IJ SOLR
125.0000 mg | Freq: Once | INTRAMUSCULAR | Status: AC
  Administered 2018-10-24: 21:00:00 125 mg via INTRAVENOUS
  Filled 2018-10-24: qty 2

## 2018-10-24 MED ORDER — DIPHENHYDRAMINE HCL 50 MG/ML IJ SOLN
25.0000 mg | Freq: Once | INTRAMUSCULAR | Status: AC
  Administered 2018-10-24: 25 mg via INTRAVENOUS
  Filled 2018-10-24: qty 1

## 2018-10-24 MED ORDER — FAMOTIDINE 20 MG PO TABS: 20.0000 mg | ORAL_TABLET | Freq: Two times a day (BID) | ORAL | 0 refills | Status: DC

## 2018-10-24 MED ORDER — PREDNISONE 10 MG PO TABS: 40.0000 mg | ORAL_TABLET | Freq: Every day | ORAL | 0 refills | Status: DC

## 2018-10-24 NOTE — ED Triage Notes (Signed)
Pt reports he took 50mg  oral Benadryl at 1900

## 2018-10-24 NOTE — ED Triage Notes (Signed)
Pt reports allergic reaction after eating [employer] x 2 hours, reports he is unsure what the reaction was too as he has eaten there before, pt has swelling to lower right lip, no throat swelling noted, denies rash or itching

## 2018-10-24 NOTE — ED Notes (Signed)
Pt states he doesn't know what hes came in contact with that caused his lip to swell. He states that hes allergic to garlic and a few medications but he hasnt had any of that today. Pt denies SOB.

## 2018-10-24 NOTE — ED Provider Notes (Signed)
[facility] EMERGENCY DEPARTMENT Provider Note   CSN: [id number] Arrival date & time: 10/24/18  2017    History   Chief Complaint Chief Complaint  Patient presents with  . Allergic Reaction    HPI Mason Perkins is a 28 y.o. male.     Patient was eating at [facility].  And then got swelling to his right lower lip.  Has had this happen before but never with any food at [employer] never been able to figure out what makes this occur.  He was told it was allergies.  Did not have any rash or hives or itching thought maybe the tongue had some slight swelling.  But he had no tightness in the throat no trouble breathing.  Patient took 25 mg of Benadryl at home.  This occurred at around 7 this evening.  Patient without any upper respiratory symptoms no fevers.     Past Medical History:  Diagnosis Date  . Asthma     Patient Active Problem List   Diagnosis Date Noted  . Weakness 10/19/2015  . Nausea and vomiting 10/19/2015  . Encounter to establish care 11/19/2014  . Routine general medical examination at a health care facility 11/19/2014  . History of scabies 11/19/2014    History reviewed. No pertinent surgical history.      Home Medications    Prior to Admission medications   Medication Sig Start Date End Date Taking? Authorizing Provider  diphenhydrAMINE (BENADRYL) 25 MG tablet Take 25 mg by mouth every 6 (six) hours as needed for allergies.   Yes [provider]  famotidine (PEPCID) 20 MG tablet Take 1 tablet (20 mg total) by mouth 2 (two) times daily. 10/24/18   Vanetta Mulders, MD  predniSONE (DELTASONE) 10 MG tablet Take 4 tablets (40 mg total) by mouth daily. 10/24/18   Vanetta Mulders, MD    Family History Family History  Problem Relation Age of Onset  . Stroke Maternal Aunt   . Diabetes Maternal Grandfather     Social History Social History   Tobacco Use  . Smoking status: Current Every Day Smoker    Packs/day: 1.00    Years: 6.00    Pack years:  6.00    Types: Cigarettes  . Smokeless tobacco: Never Used  Substance Use Topics  . Alcohol use: Yes    Alcohol/week: 0.0 standard drinks    Comment: Rare  . Drug use: No     Allergies   Aspirin and Ibuprofen   Review of Systems Review of Systems  Constitutional: Negative for chills and fever.  HENT: Positive for facial swelling. Negative for rhinorrhea, sore throat, trouble swallowing and voice change.   Eyes: Negative for visual disturbance.  Respiratory: Negative for cough, shortness of breath and wheezing.   Cardiovascular: Negative for chest pain and leg swelling.  Gastrointestinal: Negative for abdominal pain, diarrhea, nausea and vomiting.  Genitourinary: Negative for dysuria.  Musculoskeletal: Negative for back pain and neck pain.  Skin: Negative for rash.  Neurological: Negative for dizziness, light-headedness and headaches.  Hematological: Does not bruise/bleed easily.  Psychiatric/Behavioral: Negative for confusion.     Physical Exam Updated Vital Signs BP 125/78 (BP Location: Right Arm)   Pulse 82   Temp 98 F (36.7 C) (Oral)   Resp 17   Ht 1.753 m ( )   Wt 86.7 kg   SpO2 96%   BMI 28.22 kg/m   Physical Exam Vitals signs and nursing note reviewed.  Constitutional:  General: He is not in acute distress.    Appearance: Normal appearance. He is well-developed.  HENT:     Head: Normocephalic and atraumatic.     Nose: No congestion.     Mouth/Throat:     Mouth: Mucous membranes are moist.     Pharynx: Oropharynx is clear. No oropharyngeal exudate.     Comments: Swelling to the right lower lip.  No significant tongue swelling.  Posterior pharynx is clear. Eyes:     Extraocular Movements: Extraocular movements intact.     Conjunctiva/sclera: Conjunctivae normal.     Pupils: Pupils are equal, round, and reactive to light.  Neck:     Musculoskeletal: Normal range of motion and neck supple.  Cardiovascular:     Rate and Rhythm: Normal rate and  regular rhythm.     Heart sounds: Normal heart sounds. No murmur.  Pulmonary:     Effort: Pulmonary effort is normal. No respiratory distress.     Breath sounds: Normal breath sounds. No stridor. No wheezing, rhonchi or rales.  Chest:     Chest wall: No tenderness.  Abdominal:     General: Abdomen is flat.     Palpations: Abdomen is soft.     Tenderness: There is no abdominal tenderness.  Musculoskeletal: Normal range of motion.  Skin:    General: Skin is warm and dry.     Capillary Refill: Capillary refill takes less than 2 seconds.  Neurological:     General: No focal deficit present.     Mental Status: He is alert and oriented to person, place, and time.      ED Treatments / Results  Labs (all labs ordered are listed, but only abnormal results are displayed) Labs Reviewed - No data to display  EKG None  Radiology No results found.  Procedures Procedures (including critical care time)  Medications Ordered in ED Medications  methylPREDNISolone sodium succinate (SOLU-MEDROL) 125 mg/2 mL injection 125 mg (125 mg Intravenous Given 10/24/18 2044)  diphenhydrAMINE (BENADRYL) injection 25 mg (25 mg Intravenous Given 10/24/18 2044)  famotidine (PEPCID) IVPB 20 mg premix (20 mg Intravenous New Bag/Given 10/24/18 2048)     Initial Impression / Assessment and Plan / ED Course  I have reviewed the triage vital signs and the nursing notes.  Pertinent labs & imaging results that were available during my care of the patient were reviewed by me and considered in my medical decision making (see chart for details).        Patient treated here with IV Pepcid and IV Solu-Medrol.  He had already taken 25 mg of Benadryl at home.  Still has the right lower lip swelling.  But it is no worse.  Has not developed any other symptoms.  Patient safe to go home.  Patient has a history of asthma he is not on any blood pressure medicines.  Exact cause of the allergic reaction is not clear.   Patient's had no progression he remained stable.  Patient will continue prednisone for 5 days Pepcid for 7 days and Benadryl for the next 24 hours.  Work note provided.  He will return for any new or worse symptoms.  Final Clinical Impressions(s) / ED Diagnoses   Final diagnoses:  Allergic reaction, initial encounter    ED Discharge Orders         Ordered    predniSONE (DELTASONE) 10 MG tablet  Daily     10/24/18 2229    famotidine (PEPCID) 20 MG tablet  2  times daily     10/24/18 2229           Vanetta MuldersZackowski, Elajah Kunsman, MD 10/24/18 2234

## 2018-10-24 NOTE — Discharge Instructions (Signed)
Continue to take Benadryl 25 mg every 6 hours for the next 24 hours.  Take the Pepcid as directed for the next 7 days.  And continue with the prednisone for the next 5 days.  Work note provided to return to work on Friday.  Return for any new or worse symptoms swelling of the tongue difficulty breathing tightness in the throat.

## 2019-09-03 ENCOUNTER — Other Ambulatory Visit: Payer: Self-pay

## 2019-09-03 ENCOUNTER — Ambulatory Visit: Payer: 59 | Attending: Internal Medicine

## 2019-09-03 DIAGNOSIS — Z20822 Contact with and (suspected) exposure to covid-19: Secondary | ICD-10-CM

## 2019-09-04 LAB — NOVEL CORONAVIRUS, NAA: SARS-CoV-2, NAA: NOT DETECTED

## 2020-02-29 ENCOUNTER — Other Ambulatory Visit: Payer: Self-pay

## 2020-02-29 ENCOUNTER — Ambulatory Visit
Admission: EM | Admit: 2020-02-29 | Discharge: 2020-02-29 | Disposition: A | Discharge status: Home / Self Care | Payer: 59 | Attending: Emergency Medicine | Admitting: Emergency Medicine

## 2020-02-29 DIAGNOSIS — L02411 Cutaneous abscess of right axilla: Secondary | ICD-10-CM | POA: Diagnosis not present

## 2020-02-29 MED ORDER — DOXYCYCLINE HYCLATE 100 MG PO CAPS: 100.0000 mg | ORAL_CAPSULE | Freq: Two times a day (BID) | ORAL | 0 refills | Status: DC

## 2020-02-29 NOTE — ED Provider Notes (Signed)
[facility]   [id number] 02/29/20 Arrival Time: 0824   CC: ABSCESS  SUBJECTIVE:  Mason Perkins is a 29 y.o. male who presents with a possible abscess of his RT axilla x 1 week. Reports working outside prior to symptoms.  Has tried warm compresses without relief.  Tender to the touch.  Denies similar symptoms in the past.  Reports associated redness, and swelling.  Denies fever, chills, nausea, vomiting.    ROS: As per HPI.  All other pertinent ROS negative.     Past Medical History:  Diagnosis Date  . Asthma    History reviewed. No pertinent surgical history. Allergies  Allergen Reactions  . Aspirin Swelling  . Ibuprofen Swelling   No current facility-administered medications on file prior to encounter.   Current Outpatient Medications on File Prior to Encounter  Medication Sig Dispense Refill  . diphenhydrAMINE (BENADRYL) 25 MG tablet Take 25 mg by mouth every 6 (six) hours as needed for allergies.    . famotidine (PEPCID) 20 MG tablet Take 1 tablet (20 mg total) by mouth 2 (two) times daily. 30 tablet 0   Social History   Socioeconomic History  . Marital status: Single    Spouse name: Not on file  . Number of children: Not on file  . Years of education: Not on file  . Highest education level: Not on file  Occupational History  . Not on file  Tobacco Use  . Smoking status: Current Every Day Smoker    Packs/day: 1.00    Years: 6.00    Pack years: 6.00    Types: Cigarettes  . Smokeless tobacco: Never Used  Vaping Use  . Vaping Use: Never used  Substance and Sexual Activity  . Alcohol use: Yes    Alcohol/week: 0.0 standard drinks    Comment: Rare  . Drug use: No  . Sexual activity: Yes    Partners: Female    Comment: Fiance   Other Topics Concern  . Not on file  Social History Narrative   Builds spools for fiber optic wire   Lives with fiance    No children   2 dogs inside    Caffeine- 1 cup of coffee, no soda    Left handed    Enjoys watching  TV    Social Determinants of Health   Financial Resource Strain:   . Difficulty of Paying Living Expenses: Not on file  Food Insecurity:   . Worried About Programme researcher, broadcasting/film/video in the Last Year: Not on file  . Ran Out of Food in the Last Year: Not on file  Transportation Needs:   . Lack of Transportation (Medical): Not on file  . Lack of Transportation (Non-Medical): Not on file  Physical Activity:   . Days of Exercise per Week: Not on file  . Minutes of Exercise per Session: Not on file  Stress:   . Feeling of Stress : Not on file  Social Connections:   . Frequency of Communication with Friends and Family: Not on file  . Frequency of Social Gatherings with Friends and Family: Not on file  . Attends Religious Services: Not on file  . Active Member of Clubs or Organizations: Not on file  . Attends Banker Meetings: Not on file  . Marital Status: Not on file  Intimate Partner Violence:   . Fear of Current or Ex-Partner: Not on file  . Emotionally Abused: Not on file  . Physically Abused: Not on file  .  Sexually Abused: Not on file   Family History  Problem Relation Age of Onset  . Stroke Maternal Aunt   . Diabetes Maternal Grandfather     OBJECTIVE:  Vitals:   02/29/20 0858  BP: 117/80  Pulse: 82  Resp: 20  Temp: 98 F (36.7 C)  SpO2: 97%     General appearance: alert; no distress Skin: 3x3 cm induration of his RT axilla; tender to touch; no active drainage Psychological: alert and cooperative; normal mood and affect   ASSESSMENT & PLAN:  1. Abscess of right axilla     Meds ordered this encounter  Medications  . doxycycline (VIBRAMYCIN) 100 MG capsule    Sig: Take 1 capsule (100 mg total) by mouth 2 (two) times daily.    Dispense:  20 capsule    Refill:  0    Order Specific Question:   Supervising Provider    Answer:   Eustace Moore [[id number]]   Apply warm compresses 3-4x daily for 10-15 minutes Wash site daily with warm water and mild  soap Keep covered to avoid friction Take antibiotic as prescribed and to completion Follow up here or with PCP if symptoms persists Return or go to the ED if you have any new or worsening symptoms increased redness, swelling, pain, nausea, vomiting, fever, chills, etc...   Reviewed expectations re: course of current medical issues. Questions answered. Outlined signs and symptoms indicating need for more acute intervention. Patient verbalized understanding. After Visit Summary given.          Rennis Harding, PA-C 02/29/20 [phone]

## 2020-02-29 NOTE — ED Triage Notes (Signed)
Pt presents with abscess  Under right axilla that developed a few days ago

## 2020-02-29 NOTE — Discharge Instructions (Signed)
Apply warm compresses 3-4x daily for 10-15 minutes Wash site daily with warm water and mild soap Keep covered to avoid friction Take antibiotic as prescribed and to completion Follow up here or with PCP if symptoms persists Return or go to the ED if you have any new or worsening symptoms increased redness, swelling, pain, nausea, vomiting, fever, chills, etc...  

## 2021-06-29 ENCOUNTER — Other Ambulatory Visit: Payer: Self-pay

## 2021-06-29 ENCOUNTER — Ambulatory Visit
Admission: EM | Admit: 2021-06-29 | Discharge: 2021-06-29 | Disposition: A | Discharge status: Home / Self Care | Payer: 59 | Attending: Urgent Care | Admitting: Urgent Care

## 2021-06-29 DIAGNOSIS — F17200 Nicotine dependence, unspecified, uncomplicated: Secondary | ICD-10-CM | POA: Diagnosis not present

## 2021-06-29 DIAGNOSIS — R509 Fever, unspecified: Secondary | ICD-10-CM | POA: Insufficient documentation

## 2021-06-29 DIAGNOSIS — R5381 Other malaise: Secondary | ICD-10-CM | POA: Insufficient documentation

## 2021-06-29 DIAGNOSIS — Z8709 Personal history of other diseases of the respiratory system: Secondary | ICD-10-CM | POA: Diagnosis present

## 2021-06-29 DIAGNOSIS — R070 Pain in throat: Secondary | ICD-10-CM | POA: Diagnosis present

## 2021-06-29 DIAGNOSIS — B349 Viral infection, unspecified: Secondary | ICD-10-CM | POA: Diagnosis not present

## 2021-06-29 DIAGNOSIS — R5383 Other fatigue: Secondary | ICD-10-CM | POA: Insufficient documentation

## 2021-06-29 LAB — POCT RAPID STREP A (OFFICE): Rapid Strep A Screen: NEGATIVE

## 2021-06-29 MED ORDER — ACETAMINOPHEN 325 MG PO TABS: 650.0000 mg | ORAL_TABLET | Freq: Once | ORAL | Status: DC

## 2021-06-29 MED ORDER — OSELTAMIVIR PHOSPHATE 75 MG PO CAPS: 75.0000 mg | ORAL_CAPSULE | Freq: Two times a day (BID) | ORAL | 0 refills | Status: AC

## 2021-06-29 MED ORDER — BENZONATATE 100 MG PO CAPS: 100.0000 mg | ORAL_CAPSULE | Freq: Three times a day (TID) | ORAL | 0 refills | Status: AC | PRN

## 2021-06-29 MED ORDER — PROMETHAZINE-DM 6.25-15 MG/5ML PO SYRP: 5.0000 mL | ORAL_SOLUTION | Freq: Every evening | ORAL | 0 refills | Status: AC | PRN

## 2021-06-29 MED ORDER — ACETAMINOPHEN 500 MG PO TABS
1000.0000 mg | ORAL_TABLET | Freq: Once | ORAL | Status: AC
  Administered 2021-06-29: 1000 mg via ORAL

## 2021-06-29 NOTE — ED Triage Notes (Signed)
Pt reports cough, vomiting, sore throat, fever 103.0 F x 2 days.

## 2021-06-29 NOTE — Discharge Instructions (Addendum)
We will notify you of your test results as they arrive and may take between 48-72 hours.  I encourage you to sign up for MyChart if you have not already done so as this can be the easiest way for [place] to communicate results to you online or through a phone app.  Generally, we only contact you if it is a positive test result.  In the meantime, if you develop worsening symptoms including fever, chest pain, shortness of breath despite our current treatment plan then please report to the emergency room as this may be a sign of worsening status from possible viral infection.  Otherwise, we will manage this as a viral syndrome like influenza with Tamiflu. For sore throat or cough try using a honey-based tea. Use 3 teaspoons of honey with juice squeezed from half lemon. Place shaved pieces of ginger into 1/2-1 cup of water and warm over stove top. Then mix the ingredients and repeat every 4 hours as needed. Please take Tylenol 500mg -650mg  every 6 hours for aches and pains, fevers. Hydrate very well with at least 2 liters of water. Eat light meals such as soups to replenish electrolytes and soft fruits, veggies. Start an antihistamine like Zyrtec for postnasal drainage, sinus congestion.  You can take this together with pseudoephedrine (Sudafed) at a dose of 30 mg 2-3 times a day as needed for the same kind of congestion.  Use the cough medications as needed.

## 2021-06-29 NOTE — ED Provider Notes (Signed)
[facility]   MRN: [id number] DOB: 1990/10/11  Subjective:   Mason Perkins is a 31 y.o. male presenting for 2-day history of acute onset fevers, throat pains, cough, vomiting, fatigue, malaise.  Smokes daily. No chest pain, shob, wheezing. Has a history of asthma but has not needed an inhaler for more than 10 years.   Current Facility-Administered Medications:    acetaminophen (TYLENOL) tablet 650 mg, 650 mg, Oral, Once, Wallis Bamberg, PA-C No current outpatient medications on file.   Allergies  Allergen Reactions   Aspirin Swelling   Ibuprofen Swelling    Past Medical History:  Diagnosis Date   Asthma      History reviewed. No pertinent surgical history.  Family History  Problem Relation Age of Onset   Stroke Maternal Aunt    Diabetes Maternal Grandfather     Social History   Tobacco Use   Smoking status: Every Day    Packs/day: 1.00    Years: 6.00    Pack years: 6.00    Types: Cigarettes   Smokeless tobacco: Never  Vaping Use   Vaping Use: Never used  Substance Use Topics   Alcohol use: Yes    Alcohol/week: 0.0 standard drinks    Comment: Rare   Drug use: No    ROS   Objective:   Vitals: BP 111/79 (BP Location: Right Arm)    Pulse (!) 124    Temp (!) 101.3 F (38.5 C) (Oral)    Resp 20    SpO2 95%   Physical Exam Constitutional:      General: He is not in acute distress.    Appearance: Normal appearance. He is well-developed. He is not ill-appearing, toxic-appearing or diaphoretic.  HENT:     Head: Normocephalic and atraumatic.     Right Ear: External ear normal.     Left Ear: External ear normal.     Nose: Nose normal.     Mouth/Throat:     Mouth: Mucous membranes are moist.     Pharynx: No oropharyngeal exudate or posterior oropharyngeal erythema.  Eyes:     General: No scleral icterus.       Right eye: No discharge.        Left eye: No discharge.     Extraocular Movements: Extraocular movements intact.      Conjunctiva/sclera: Conjunctivae normal.  Cardiovascular:     Rate and Rhythm: Normal rate and regular rhythm.     Heart sounds: Normal heart sounds. No murmur heard.   No friction rub. No gallop.  Pulmonary:     Effort: Pulmonary effort is normal. No respiratory distress.     Breath sounds: Normal breath sounds. No stridor. No wheezing, rhonchi or rales.  Neurological:     Mental Status: He is alert and oriented to person, place, and time.  Psychiatric:        Mood and Affect: Mood normal.        Behavior: Behavior normal.        Thought Content: Thought content normal.    Results for orders placed or performed during the hospital encounter of 06/29/21 (from the past 24 hour(s))  POCT rapid strep A     Status: None   Collection Time: 06/29/21 10:29 AM  Result Value Ref Range   Rapid Strep A Screen Negative Negative   Patient given Tylenol in clinic.  Assessment and Plan :   PDMP not reviewed this encounter.  1. Acute viral syndrome   2. Fever, unspecified  3. Throat pain   4. Malaise and fatigue   5. History of asthma   6. Smoker    Deferred imaging given clear cardiopulmonary exam, hemodynamically stable vital signs.  Strep culture, COVID and flu test pending.  Will cover for influenza with Tamiflu given fever, symptom set, current incidence in the community.  Use supportive care, rest, fluids, hydration, light meals, schedule Tylenol and ibuprofen. Counseled patient on potential for adverse effects with medications prescribed today, patient verbalized understanding. ER and return-to-clinic precautions discussed, patient verbalized understanding.    Wallis Bamberg, PA-C 06/29/21 1046

## 2021-06-30 LAB — COVID-19, FLU A+B NAA
Influenza A, NAA: NOT DETECTED
Influenza B, NAA: NOT DETECTED
SARS-CoV-2, NAA: DETECTED — AB

## 2021-07-02 LAB — CULTURE, GROUP A STREP (THRC)
